# Patient Record
Sex: Male | Born: 1995 | Race: Black or African American | Hispanic: No | Marital: Single | Smoking: Never smoker
Health system: Southern US, Community
[De-identification: ages and names within clinical notes are randomized; demographics above are authoritative.]

## PROBLEM LIST (undated history)

## (undated) DIAGNOSIS — G43909 Migraine, unspecified, not intractable, without status migrainosus: Secondary | ICD-10-CM

## (undated) HISTORY — DX: Migraine, unspecified, not intractable, without status migrainosus: G43.909

---
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
## 2015-01-10 ENCOUNTER — Emergency Department
Admit: 2015-01-10 | Disposition: A | Discharge status: Home / Self Care | Payer: Self-pay | Admitting: Emergency Medicine

## 2015-01-12 ENCOUNTER — Telehealth: Payer: Self-pay | Admitting: Neurology

## 2015-01-12 NOTE — Telephone Encounter (Signed)
The patient is requesting a letter stating that he has migraines and he would like to have that letter.

## 2015-01-12 NOTE — Telephone Encounter (Signed)
I called, left a message. We have no documentation of any visit that the patient had to our office where he was turned away. I am unable to write a letter if I have no documentation.

## 2015-01-12 NOTE — Telephone Encounter (Signed)
I called the patient's mother. She stated the patient needed a letter stating he came to our office to try to see Dr. Anne HahnWillis, but Dr. Anne HahnWillis could not see him, so he will not have an absence counted against him at school. She is not sure what day this happened though. She is going to call back with more details about when this was and what the letter should say.

## 2015-01-23 ENCOUNTER — Encounter: Payer: Self-pay | Admitting: Neurology

## 2015-01-23 NOTE — Telephone Encounter (Signed)
I spoke to the patient. He would like a letter stating that he has migraines secondary to surgery that periodically prevent him from going to school between the months of January and May.

## 2015-01-23 NOTE — Telephone Encounter (Signed)
I will write a letter stating the diagnosis for this patient.

## 2015-01-23 NOTE — Telephone Encounter (Signed)
Patient called stating that he needs a letter stating what his condition is to why he is out for school purpose.  Please call and advice # [phone]

## 2015-01-24 NOTE — Telephone Encounter (Signed)
Left voicemail stating the letter the patient requested is ready for pick up.

## 2015-02-08 ENCOUNTER — Ambulatory Visit: Payer: Medicaid Other | Admitting: Neurology

## 2015-02-08 ENCOUNTER — Telehealth: Payer: Self-pay | Admitting: Neurology

## 2015-02-08 NOTE — Telephone Encounter (Signed)
Pt's mother called and stated that the patient had end of grade testing today at 2:00 and they just find out yesterday that he would have to stay and could not make his appt. She said that she tried to call yesterday afternoon to r/s the appt but did not get an answer. She would like to r/s his appt before he leaves for college and she would like to speak with the nurse regarding the appt and some issues her son has been having. She states that he has an "itching" feeling on his scalp a lot. Please call and advise.

## 2015-02-08 NOTE — Telephone Encounter (Signed)
I called the mother. The patient has itching only at night on top of the head near the surgical scar. The patient is on Benadryl taking 50 mg at night which helped some, may need to go up to 75 mg of Benadryl at night for sleep and to help the itching.

## 2015-02-08 NOTE — Telephone Encounter (Signed)
I called the patient's mother. She stated the patient itches all over his head. She said she thought it was from the scar healing from his surgery, but he actually scratches all over his head. She wondered if this can be nerve damage and if anything can be done about it. She stated the patient scratches his head in his sleep and sometimes until he makes his head bleed. Appointment for today r/s to 6/8.

## 2015-02-22 ENCOUNTER — Telehealth: Payer: Self-pay

## 2015-02-22 ENCOUNTER — Ambulatory Visit: Payer: Medicaid Other | Admitting: Neurology

## 2015-02-22 NOTE — Telephone Encounter (Signed)
Patient cancelled appointment right before he was supposed to be here.

## 2015-02-23 ENCOUNTER — Telehealth: Payer: Self-pay

## 2015-02-23 ENCOUNTER — Ambulatory Visit: Payer: Medicaid Other | Admitting: Neurology

## 2015-02-23 NOTE — Telephone Encounter (Signed)
Patient did not come to a follow up appointment. 

## 2015-03-07 ENCOUNTER — Encounter: Payer: Self-pay | Admitting: Neurology

## 2015-05-03 ENCOUNTER — Ambulatory Visit: Payer: Medicaid Other | Admitting: Neurology

## 2015-09-21 ENCOUNTER — Emergency Department
Admission: EM | Admit: 2015-09-21 | Discharge: 2015-09-21 | Disposition: A | Discharge status: Home / Self Care | Payer: Medicaid Other | Attending: Emergency Medicine | Admitting: Emergency Medicine

## 2015-09-21 ENCOUNTER — Encounter: Payer: Self-pay | Admitting: Urgent Care

## 2015-09-21 ENCOUNTER — Emergency Department: Payer: Medicaid Other

## 2015-09-21 DIAGNOSIS — Y998 Other external cause status: Secondary | ICD-10-CM | POA: Diagnosis not present

## 2015-09-21 DIAGNOSIS — W2203XA Walked into furniture, initial encounter: Secondary | ICD-10-CM | POA: Diagnosis not present

## 2015-09-21 DIAGNOSIS — Y9289 Other specified places as the place of occurrence of the external cause: Secondary | ICD-10-CM | POA: Insufficient documentation

## 2015-09-21 DIAGNOSIS — Z7951 Long term (current) use of inhaled steroids: Secondary | ICD-10-CM | POA: Diagnosis not present

## 2015-09-21 DIAGNOSIS — S0990XA Unspecified injury of head, initial encounter: Secondary | ICD-10-CM | POA: Diagnosis present

## 2015-09-21 DIAGNOSIS — Y9389 Activity, other specified: Secondary | ICD-10-CM | POA: Insufficient documentation

## 2015-09-21 DIAGNOSIS — Z79899 Other long term (current) drug therapy: Secondary | ICD-10-CM | POA: Diagnosis not present

## 2015-09-21 DIAGNOSIS — S060X0A Concussion without loss of consciousness, initial encounter: Secondary | ICD-10-CM | POA: Diagnosis not present

## 2015-09-21 NOTE — ED Notes (Addendum)
Pt c/o episode of confusion after hitting head. Pt reports PMH of head injury.

## 2015-09-21 NOTE — ED Provider Notes (Addendum)
[facility] Emergency Department Provider Note  ____________________________________________  Time seen: Approximately 11:14 PM  I have reviewed the triage vital signs and the nursing notes.   HISTORY  Chief Complaint Head Injury    HPI Jonathan Livingston is a 20 y.o. male with history of brain surgery approximately 2 years ago, who presents with trauma to the posterior, superior scalp occurring 2 days ago. He hit his head on a dresser with low impact. However he has felt more fatigued, drowsy with occasional nausea. He is also felt his thinking isn't as clear as normal. He denies vision changes. He had initial swelling and tenderness to the area but that has resolved. No neck pain arm pain. This felt chilled, but denies sore throat cough or congestion. No abdominal pain.   Past Medical History  Diagnosis Date  . Asthma   . Migraine 11/02/2014    Patient Active Problem List   Diagnosis Date Noted  . Migraine 11/02/2014    Past Surgical History  Procedure Laterality Date  . Brain surgery Right 2015    abcess  . Eye surgery Right 2015    Current Outpatient Rx  Name  Route  Sig  Dispense  Refill  . albuterol (PROVENTIL HFA;VENTOLIN HFA) 108 (90 BASE) MCG/ACT inhaler   Inhalation   Inhale 2 puffs into the lungs every 6 (six) hours as needed for wheezing or shortness of breath (sob).         . fexofenadine (ALLEGRA) 180 MG tablet   Oral   Take 180 mg by mouth daily.         . fluticasone (FLONASE) 50 MCG/ACT nasal spray      Use 2 spray each nare daily         . ibuprofen (ADVIL,MOTRIN) 800 MG tablet   Oral   Take 800 mg by mouth every 8 (eight) hours as needed (fever pain).         Marland Kitchen ketorolac (TORADOL) 10 MG tablet   Oral   Take 1 tablet (10 mg total) by mouth every 8 (eight) hours as needed.   20 tablet   1   . topiramate (TOPAMAX) 25 MG tablet      Take one tablet at night for one week, then take 2 tablets at night for one  week, then take 3 tablets at night.   90 tablet   3     Allergies Review of patient's allergies indicates no known allergies.  Family History  Problem Relation Age of Onset  . Asthma Mother   . Migraines Mother   . Diabetes Father   . Hypertension Father   . Healthy Sister   . Migraines Brother     Social History Social History  Substance Use Topics  . Smoking status: Never Smoker   . Smokeless tobacco: Never Used  . Alcohol Use: No    Review of Systems Constitutional: No fever/chills Eyes: No visual changes. ENT: No sore throat. Cardiovascular: Denies chest pain. Respiratory: Denies shortness of breath. Gastrointestinal: No abdominal pain.  No nausea, no vomiting.  No diarrhea.  No constipation. Genitourinary: Negative for dysuria. Musculoskeletal: Negative for back pain. Skin: Negative for rash. Neurological: Negative for headaches, focal weakness or numbness. 10-point ROS otherwise negative.  ____________________________________________   PHYSICAL EXAM:  VITAL SIGNS: ED Triage Vitals  Enc Vitals Group     BP --      Pulse --      Resp --  Temp --      Temp src --      SpO2 --      Weight --      Height --      Head Cir --      Peak Flow --      Pain Score 09/21/15 2236 0     Pain Loc --      Pain Edu? --      Excl. in GC? --     Constitutional: Alert and oriented. Well appearing and in no acute distress. Eyes: Conjunctivae are normal. PERRL. EOMI. Ears:  Clear with normal landmarks. No erythema. Head: Atraumatic. Nontender. No swelling. Nose: No congestion/rhinnorhea. Mouth/Throat: Mucous membranes are moist.  Oropharynx non-erythematous. No lesions. Neck:  Supple.  No adenopathy.   Cardiovascular: Normal rate, regular rhythm. Grossly normal heart sounds.  Good peripheral circulation. Respiratory: Normal respiratory effort.  No retractions. Lungs CTAB. Musculoskeletal: Nml ROM of upper and lower extremity joints. Neurologic:  Normal  speech and language. No gross focal neurologic deficits are appreciated. No gait instability. Skin:  Skin is warm, dry and intact. No rash noted. Psychiatric: Mood and affect are normal. Speech and behavior are normal.  ____________________________________________   LABS (all labs ordered are listed, but only abnormal results are displayed)  Labs Reviewed - No data to display ____________________________________________  EKG    ____________________________________________  RADIOLOGY  CLINICAL DATA: Acute onset of worsening confusion and somnolence. Recent head injury 2 days ago. Hit head on dresser. Initial encounter.  EXAM: CT HEAD WITHOUT CONTRAST  TECHNIQUE: Contiguous axial images were obtained from the base of the skull through the vertex without intravenous contrast.  COMPARISON: None.  FINDINGS: There is no evidence of acute infarction, mass lesion, or intra- or extra-axial hemorrhage on CT.  The posterior fossa, including the cerebellum, brainstem and fourth ventricle, is within normal limits. The third and lateral ventricles, and basal ganglia are unremarkable in appearance. The cerebral hemispheres are symmetric in appearance, with normal gray-white differentiation. No mass effect or midline shift is seen.  There is no evidence of fracture; postoperative change is noted at the right frontoparietal calvarium. The visualized portions of the orbits are within normal limits. The paranasal sinuses and mastoid air cells are well-aerated. No significant soft tissue abnormalities are seen.  IMPRESSION: No evidence of traumatic intracranial injury or fracture.   Electronically Signed  By: Roanna RaiderJeffery Chang M.D.  On: 09/21/2015 23:20  ____________________________________________   PROCEDURES  Procedure(s) performed: None  Critical Care performed: No  ____________________________________________   INITIAL IMPRESSION / ASSESSMENT AND PLAN  / ED COURSE  Pertinent labs & imaging results that were available during my care of the patient were reviewed by me and considered in my medical decision making (see chart for details).  20 year old male with history of brain surgery approximately 2 years ago who presents with head injury occurring 2 days ago. See history of present illness. CT of the head performed showing no acute findings. He is encouraged to rest. Given work note for tomorrow. Encouraged follow-up with his primary physician if symptoms do not improve. ____________________________________________   FINAL CLINICAL IMPRESSION(S) / ED DIAGNOSES  Final diagnoses:  Concussion, without loss of consciousness, initial encounter      Ignacia BayleyRobert Cathlin Buchan, PA-C 09/21/15 [id number]  Jeanmarie PlantJames A McShane, MD 09/21/15 2330  Ignacia Bayleyobert Chenae Brager, PA-C 09/21/15 [phone]  Jeanmarie PlantJames A McShane, MD 09/22/15 [phone]

## 2015-09-21 NOTE — ED Notes (Signed)
Patient presents with c/o increased confusion and somnolence following a head injury x 2 days ago. Patient reports that he hit his head on a dresser - denies LOC. Patient CAO x 4 in triage. 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

## 2015-09-21 NOTE — Discharge Instructions (Signed)
Head Injury, Adult °You have received a head injury. It does not appear serious at this time. Headaches and vomiting are common following head injury. It should be easy to awaken from sleeping. Sometimes it is necessary for you to stay in the emergency department for a while for observation. Sometimes admission to the hospital may be needed. After injuries such as yours, most problems occur within the first 24 hours, but side effects may occur up to 7-10 days after the injury. It is important for you to carefully monitor your condition and contact your health care provider or seek immediate medical care if there is a change in your condition. °WHAT ARE THE TYPES OF HEAD INJURIES? °Head injuries can be as minor as a bump. Some head injuries can be more severe. More severe head injuries include: °· A jarring injury to the brain (concussion). °· A bruise of the brain (contusion). This mean there is bleeding in the brain that can cause swelling. °· A cracked skull (skull fracture). °· Bleeding in the brain that collects, clots, and forms a bump (hematoma). °WHAT CAUSES A HEAD INJURY? °A serious head injury is most likely to happen to someone who is in a car wreck and is not wearing a seat belt. Other causes of major head injuries include bicycle or motorcycle accidents, sports injuries, and falls. °HOW ARE HEAD INJURIES DIAGNOSED? °A complete history of the event leading to the injury and your current symptoms will be helpful in diagnosing head injuries. Many times, pictures of the brain, such as CT or MRI are needed to see the extent of the injury. Often, an overnight hospital stay is necessary for observation.  °WHEN SHOULD I SEEK IMMEDIATE MEDICAL CARE?  °You should get help right away if: °· You have confusion or drowsiness. °· You feel sick to your stomach (nauseous) or have continued, forceful vomiting. °· You have dizziness or unsteadiness that is getting worse. °· You have severe, continued headaches not  relieved by medicine. Only take over-the-counter or prescription medicines for pain, fever, or discomfort as directed by your health care provider. °· You do not have normal function of the arms or legs or are unable to walk. °· You notice changes in the black spots in the center of the colored part of your eye (pupil). °· You have a clear or bloody fluid coming from your nose or ears. °· You have a loss of vision. °During the next 24 hours after the injury, you must stay with someone who can watch you for the warning signs. This person should contact local emergency services (911 in the [place] if you have seizures, you become unconscious, or you are unable to wake up. °HOW CAN I PREVENT A HEAD INJURY IN THE FUTURE? °The most important factor for preventing major head injuries is avoiding motor vehicle accidents.  To minimize the potential for damage to your head, it is crucial to wear seat belts while riding in motor vehicles. Wearing helmets while bike riding and playing collision sports (like football) is also helpful. Also, avoiding dangerous activities around the house will further help reduce your risk of head injury.  °WHEN CAN I RETURN TO NORMAL ACTIVITIES AND ATHLETICS? °You should be reevaluated by your health care provider before returning to these activities. If you have any of the following symptoms, you should not return to activities or contact sports until 1 week after the symptoms have stopped: °· Persistent headache. °· Dizziness or vertigo. °· Poor attention and concentration. °· Confusion. °·   Memory problems.  Nausea or vomiting.  Fatigue or tire easily.  Irritability.  Intolerant of bright lights or loud noises.  Anxiety or depression.  Disturbed sleep. MAKE SURE YOU:   Understand these instructions.  Will watch your condition.  Will get help right away if you are not doing well or get worse.   This information is not intended to replace advice given to you by your health  care provider. Make sure you discuss any questions you have with your health care provider.   Document Released: 09/02/2005 Document Revised: 09/23/2014 Document Reviewed: 05/10/2013 Elsevier Interactive Patient Education [employer]   Follow-up with your primary physician next week if symptoms do not resolve. Return to the emergency room for any concerns.

## 2015-09-21 NOTE — ED Notes (Signed)
Stafford,MD consulted. MD made aware of presenting complaints and triage assessment. MD with VORB for: CT head without contrast. Orders to be entered and carried by this RN.

## 2017-09-25 ENCOUNTER — Telehealth: Payer: Self-pay | Admitting: Neurology

## 2017-09-25 ENCOUNTER — Institutional Professional Consult (permissible substitution): Payer: Self-pay | Admitting: Neurology

## 2017-09-25 NOTE — Telephone Encounter (Signed)
This patient did not show for a new patient appointment today.  It appears that he no showed on 3 occasions in 2016, not sure why he was not discharged from our practice.

## 2017-09-26 ENCOUNTER — Encounter: Payer: Self-pay | Admitting: Neurology

## 2018-09-22 ENCOUNTER — Encounter: Payer: Self-pay | Admitting: Emergency Medicine

## 2018-09-22 ENCOUNTER — Other Ambulatory Visit: Payer: Self-pay

## 2018-09-22 ENCOUNTER — Emergency Department
Admission: EM | Admit: 2018-09-22 | Discharge: 2018-09-23 | Disposition: A | Discharge status: Home / Self Care | Payer: 59 | Attending: Emergency Medicine | Admitting: Emergency Medicine

## 2018-09-22 DIAGNOSIS — J45909 Unspecified asthma, uncomplicated: Secondary | ICD-10-CM | POA: Insufficient documentation

## 2018-09-22 DIAGNOSIS — Z79899 Other long term (current) drug therapy: Secondary | ICD-10-CM | POA: Insufficient documentation

## 2018-09-22 DIAGNOSIS — J111 Influenza due to unidentified influenza virus with other respiratory manifestations: Secondary | ICD-10-CM | POA: Insufficient documentation

## 2018-09-22 DIAGNOSIS — R6889 Other general symptoms and signs: Secondary | ICD-10-CM

## 2018-09-22 DIAGNOSIS — B958 Unspecified staphylococcus as the cause of diseases classified elsewhere: Secondary | ICD-10-CM | POA: Insufficient documentation

## 2018-09-22 DIAGNOSIS — L089 Local infection of the skin and subcutaneous tissue, unspecified: Secondary | ICD-10-CM | POA: Insufficient documentation

## 2018-09-22 DIAGNOSIS — Z20828 Contact with and (suspected) exposure to other viral communicable diseases: Secondary | ICD-10-CM | POA: Insufficient documentation

## 2018-09-22 LAB — INFLUENZA PANEL BY PCR (TYPE A & B)
INFLAPCR: NEGATIVE
INFLBPCR: NEGATIVE

## 2018-09-22 MED ORDER — SULFAMETHOXAZOLE-TRIMETHOPRIM 800-160 MG PO TABS
1.0000 | ORAL_TABLET | Freq: Once | ORAL | Status: AC
  Administered 2018-09-23: 1 via ORAL
  Filled 2018-09-22: qty 1

## 2018-09-22 NOTE — ED Notes (Signed)
Patient c/o nasal congestion, N/V, productive cough, dizziness, diaphoresis, rash/redness to nose, and intermittent fever.

## 2018-09-22 NOTE — ED Provider Notes (Signed)
[facility] Emergency Department Provider Note  ____________________________________________  Time seen: Approximately 11:48 PM  I have reviewed the triage vital signs and the nursing notes.   HISTORY  Chief Complaint Influenza   HPI Jonathan Livingston is a 23 y.o. male who presents to the ER for evaluation and treatment of fever, chills, night sweats, cough, nasal congestion, intermittent vomiting and a rash to his nose. Sick contacts positive for flu. He has had the same rash in the past that lead to a sinus surgery. Rash started 2 days ago and is spreading around his nares bilaterally and inside the nose. No alleviating measures attempted prior to arrival.   Past Medical History:  Diagnosis Date  . Asthma   . Migraine 11/02/2014    Patient Active Problem List   Diagnosis Date Noted  . Migraine 11/02/2014    Past Surgical History:  Procedure Laterality Date  . BRAIN SURGERY Right 2015   abcess  . EYE SURGERY Right 2015    Prior to Admission medications   Medication Sig Start Date End Date Taking? Authorizing Provider  albuterol (PROVENTIL HFA;VENTOLIN HFA) 108 (90 BASE) MCG/ACT inhaler Inhale 2 puffs into the lungs every 6 (six) hours as needed for wheezing or shortness of breath (sob).    [provider]  fexofenadine (ALLEGRA) 180 MG tablet Take 180 mg by mouth daily.    [provider]  fluticasone Aleda Grana) 50 MCG/ACT nasal spray Use 2 spray each nare daily 10/13/13   [provider]  ibuprofen (ADVIL,MOTRIN) 800 MG tablet Take 800 mg by mouth every 8 (eight) hours as needed (fever pain).    [provider]  ketorolac (TORADOL) 10 MG tablet Take 1 tablet (10 mg total) by mouth every 8 (eight) hours as needed. 12/19/14   York Spaniel, MD  promethazine-codeine (PHENERGAN WITH CODEINE) 6.25-10 MG/5ML syrup Take 5 mLs by mouth every 6 (six) hours as needed for cough. 09/23/18   Brie Eppard, Rulon Eisenmenger B, FNP   sulfamethoxazole-trimethoprim (BACTRIM DS,SEPTRA DS) 800-160 MG tablet Take 1 tablet by mouth 2 (two) times daily. 09/23/18   Nasira Janusz, Rulon Eisenmenger B, FNP  topiramate (TOPAMAX) 25 MG tablet Take one tablet at night for one week, then take 2 tablets at night for one week, then take 3 tablets at night. 11/02/14   York Spaniel, MD    Allergies Patient has no known allergies.  Family History  Problem Relation Age of Onset  . Asthma Mother   . Migraines Mother   . Diabetes Father   . Hypertension Father   . Healthy Sister   . Migraines Brother     Social History Social History   Tobacco Use  . Smoking status: Never Smoker  . Smokeless tobacco: Never Used  Substance Use Topics  . Alcohol use: No  . Drug use: Not on file    Review of Systems Constitutional: Positive for fever/chills. Decreased appetite. ENT: Negative sore throat. Cardiovascular: Denies chest pain. Respiratory: Negative for shortness of breath. Positive for cough. Negative for wheezing.  Gastrointestinal: Positive for nausea,  intermittent vomiting.  Negative for diarrhea.  Musculoskeletal: Positive for body aches Skin: Positive for rash. Neurological: Negative for headaches ____________________________________________   PHYSICAL EXAM:  VITAL SIGNS: ED Triage Vitals  Enc Vitals Group     BP 09/22/18 2138 139/72     Pulse Rate 09/22/18 2138 72     Resp 09/22/18 2138 16     Temp 09/22/18 2138 98.7 F (37.1 C)  Temp Source 09/22/18 2138 Oral     SpO2 09/22/18 2138 99 %     Weight 09/22/18 2140 170 lb (77.1 kg)     Height 09/22/18 2140 5\' 9"  (1.753 m)     Head Circumference --      Peak Flow --      Pain Score 09/22/18 2143 6     Pain Loc --      Pain Edu? --      Excl. in GC? --     Constitutional: Alert and oriented. Well appearing and in no acute distress. Eyes: Conjunctivae are normal. Ears: Bilateral TM normal Nose: Maxillary sinus congestion noted; no rhinnorhea. Mouth/Throat: Mucous  membranes are moist.  Oropharynx mildly erythematous without edema. Tonsils without exudate. Uvula midline. Neck: No stridor.  Lymphatic: No cervical lymphadenopathy. Cardiovascular: Normal rate, regular rhythm. Good peripheral circulation. Respiratory: Respirations are even and unlabored.  No retractions. Breath sounds clear to auscultation.. Gastrointestinal: Soft and nontender.  Musculoskeletal: FROM x 4 extremities.  Neurologic:  Normal speech and language. Skin:  Skin is warm, dry and intact. Skin on upper lip and around nares has a crusted exudate on an erythematous base. Nasal mucosa bilaterally with papules on an erythematous base. Psychiatric: Mood and affect are normal. Speech and behavior are normal.  ____________________________________________   LABS (all labs ordered are listed, but only abnormal results are displayed)  Labs Reviewed  INFLUENZA PANEL BY PCR (TYPE A & B)   ____________________________________________  EKG  Not indicated. ____________________________________________  RADIOLOGY  Not indicated. ____________________________________________   PROCEDURES  Procedure(s) performed: None  Critical Care performed: No ____________________________________________   INITIAL IMPRESSION / ASSESSMENT AND PLAN / ED COURSE  23 y.o. male presents to the ER for evaluation of flu-like symptoms and rash on the nose. He will be treated symptomatically for the viral symptoms and given Bactrim for the suspected MRSA of the nose. He is to follow up with the primary care provider for symptoms that are not improving over the week. He is to return to the ER for symptoms that change or worsen if unable to schedule an appointment.   Medications  sulfamethoxazole-trimethoprim (BACTRIM DS,SEPTRA DS) 800-160 MG per tablet 1 tablet (1 tablet Oral Given 09/23/18 0011)    ED Discharge Orders         Ordered    sulfamethoxazole-trimethoprim (BACTRIM DS,SEPTRA DS) 800-160 MG  tablet  2 times daily     09/23/18 0002    promethazine-codeine (PHENERGAN WITH CODEINE) 6.25-10 MG/5ML syrup  Every 6 hours PRN     09/23/18 0002           Pertinent labs & imaging results that were available during my care of the patient were reviewed by me and considered in my medical decision making (see chart for details).    If controlled substance prescribed during this visit, 12 month history viewed on the [facility] prior to issuing an initial prescription for Schedule II or III opiod. ____________________________________________   FINAL CLINICAL IMPRESSION(S) / ED DIAGNOSES  Final diagnoses:  Flu-like symptoms  Infection, skin, staph    Note:  This document was prepared using Dragon voice recognition software and may include unintentional dictation errors.     Chinita Pester, FNP 09/23/18 1559    Dionne Bucy, MD 09/23/18 [phone]

## 2018-09-22 NOTE — ED Triage Notes (Signed)
Pt arriving to the ED for complaints of flu like symptoms, cough, runny nose and fever. Pt reports that his girlfriend was diagnosed with the Flu and now he believes that he has it. Pt is AOx4 in no apparent distress.

## 2018-09-22 NOTE — ED Notes (Signed)
Patient reports headache with swelling/pus in the past leading to sinus surgery. Patient is unsure if pain/nasal swelling is related to this.

## 2018-09-23 MED ORDER — SULFAMETHOXAZOLE-TRIMETHOPRIM 800-160 MG PO TABS: 1.0000 | ORAL_TABLET | Freq: Two times a day (BID) | ORAL | 0 refills | Status: AC

## 2018-09-23 MED ORDER — PROMETHAZINE-CODEINE 6.25-10 MG/5ML PO SYRP: 5.0000 mL | ORAL_SOLUTION | Freq: Four times a day (QID) | ORAL | 0 refills | Status: AC | PRN

## 2018-09-23 NOTE — Discharge Instructions (Signed)
Follow up with the primary care provider for symptoms that are not improving over the week. ° °Return to the ER for symptoms that change or worsen if unable to schedule an appointment. °

## 2019-04-13 ENCOUNTER — Ambulatory Visit: Payer: Self-pay

## 2019-04-13 ENCOUNTER — Encounter: Payer: Self-pay | Admitting: Emergency Medicine

## 2019-04-13 ENCOUNTER — Emergency Department
Admission: EM | Admit: 2019-04-13 | Discharge: 2019-04-13 | Disposition: A | Discharge status: Home / Self Care | Payer: Self-pay | Attending: Emergency Medicine | Admitting: Emergency Medicine

## 2019-04-13 ENCOUNTER — Other Ambulatory Visit: Payer: Self-pay

## 2019-04-13 DIAGNOSIS — J45909 Unspecified asthma, uncomplicated: Secondary | ICD-10-CM | POA: Insufficient documentation

## 2019-04-13 DIAGNOSIS — K625 Hemorrhage of anus and rectum: Secondary | ICD-10-CM | POA: Insufficient documentation

## 2019-04-13 LAB — URINALYSIS, COMPLETE (UACMP) WITH MICROSCOPIC
Bacteria, UA: NONE SEEN
Bilirubin Urine: NEGATIVE
Glucose, UA: NEGATIVE mg/dL
Ketones, ur: NEGATIVE mg/dL
Leukocytes,Ua: NEGATIVE
Nitrite: NEGATIVE
Protein, ur: NEGATIVE mg/dL
Specific Gravity, Urine: 1.024 (ref 1.005–1.030)
Squamous Epithelial / HPF: NONE SEEN (ref 0–5)
pH: 6 (ref 5.0–8.0)

## 2019-04-13 LAB — CBC
HCT: 42.4 % (ref 39.0–52.0)
Hemoglobin: 14.2 g/dL (ref 13.0–17.0)
MCH: 28.6 pg (ref 26.0–34.0)
MCHC: 33.5 g/dL (ref 30.0–36.0)
MCV: 85.5 fL (ref 80.0–100.0)
Platelets: 195 10*3/uL (ref 150–400)
RBC: 4.96 MIL/uL (ref 4.22–5.81)
RDW: 13.1 % (ref 11.5–15.5)
WBC: 4.7 10*3/uL (ref 4.0–10.5)
nRBC: 0 % (ref 0.0–0.2)

## 2019-04-13 LAB — LIPASE, BLOOD: Lipase: 20 U/L (ref 11–51)

## 2019-04-13 LAB — COMPREHENSIVE METABOLIC PANEL
ALT: 22 U/L (ref 0–44)
AST: 24 U/L (ref 15–41)
Albumin: 4.6 g/dL (ref 3.5–5.0)
Alkaline Phosphatase: 71 U/L (ref 38–126)
Anion gap: 8 (ref 5–15)
BUN: 16 mg/dL (ref 6–20)
CO2: 25 mmol/L (ref 22–32)
Calcium: 9.4 mg/dL (ref 8.9–10.3)
Chloride: 101 mmol/L (ref 98–111)
Creatinine, Ser: 0.83 mg/dL (ref 0.61–1.24)
GFR calc Af Amer: >60 mL/min (ref >60)
GFR calc non Af Amer: >60 mL/min (ref >60)
Glucose, Bld: 115 mg/dL — ABNORMAL HIGH (ref 70–99)
Potassium: 3.6 mmol/L (ref 3.5–5.1)
Sodium: 134 mmol/L — ABNORMAL LOW (ref 135–145)
Total Bilirubin: 0.6 mg/dL (ref 0.3–1.2)
Total Protein: 8.2 g/dL — ABNORMAL HIGH (ref 6.5–8.1)

## 2019-04-13 MED ORDER — SODIUM CHLORIDE 0.9% FLUSH: 3.0000 mL | Freq: Once | INTRAVENOUS | Status: DC

## 2019-04-13 MED ORDER — DICYCLOMINE HCL 20 MG PO TABS: 20.0000 mg | ORAL_TABLET | Freq: Three times a day (TID) | ORAL | 0 refills | Status: AC | PRN

## 2019-04-13 MED ORDER — SUCRALFATE 1 G PO TABS: 1.0000 g | ORAL_TABLET | Freq: Four times a day (QID) | ORAL | 0 refills | Status: AC

## 2019-04-13 NOTE — ED Triage Notes (Signed)
Blood in stool x 2 over past two days.  Denies constipation.  Patient is AAOx3.  Skin warm and dry. NAD

## 2019-04-13 NOTE — Telephone Encounter (Signed)
Pt called with 2 episodes of rectal bleeding. Started yesterday that pt noted dark red blood on toilet paper but did not look into bowl. Today pt had another episodes of blood in stool that turned the toilet water red. Pt also c/o lightheadedness. Pt advised to go ED but have someone to drive him. Pt stated he will go to [facility] ED. Care advice given and pt verbalized understanding.  Reason for Disposition . SEVERE dizziness (e.g., unable to stand, requires support to walk, feels like passing out now)  Answer Assessment - Initial Assessment Questions 1. APPEARANCE of BLOOD: "What color is it?" "Is it passed separately, on the surface of the stool, or mixed in with the stool?"      Dark red - on and mixed in with the stool 2. AMOUNT: "How much blood was passed?"      Colored water pink 3. FREQUENCY: "How many times has blood been passed with the stools?"      2 4. ONSET: "When was the blood first seen in the stools?" (Days or weeks)      yesterday 5. DIARRHEA: "Is there also some diarrhea?" If so, ask: "How many diarrhea stools were passed in past 24 hours?"      no 6. CONSTIPATION: "Do you have constipation?" If so, "How bad is it?"     no 7. RECURRENT SYMPTOMS: "Have you had blood in your stools before?" If so, ask: "When was the last time?" and "What happened that time?"      no 8. BLOOD THINNERS: "Do you take any blood thinners?" (e.g., Coumadin/warfarin, Pradaxa/dabigatran, aspirin)     no 9. OTHER SYMPTOMS: "Do you have any other symptoms?"  (e.g., abdominal pain, vomiting, dizziness, fever)     Feels lightheaded and weak 10. PREGNANCY: "Is there any chance you are pregnant?" "When was your last menstrual period?"       n/a  Protocols used: RECTAL BLEEDING-A-AH

## 2019-04-13 NOTE — Discharge Instructions (Signed)
Please seek medical attention for any high fevers, chest pain, shortness of breath, change in behavior, persistent vomiting, bloody stool or any other new or concerning symptoms.  

## 2019-04-13 NOTE — ED Provider Notes (Signed)
[facility] Emergency Department Provider Note  ____________________________________________   I have reviewed the triage vital signs and the nursing notes.   HISTORY  Chief Complaint Rectal Bleeding   History limited by: Not Limited   HPI Jonathan Livingston is a 23 y.o. male who presents to the emergency department today because of concerns for rectal bleeding.  Patient states it started yesterday.  He noticed some blood when wiping after having a bowel movement.  Today he noticed some further blood and blood in the toilet.  The patient states he has had some mild abdominal discomfort but nothing significant.  He denies any straining while stooling or prolonged time on the toilet.  Denies any history of intestinal disorder.  Denies any known intestinal disorder history in close family.    Records reviewed. Per medical record review patient has a history of asthma, migraine.  Past Medical History:  Diagnosis Date  . Asthma   . Migraine 11/02/2014    Patient Active Problem List   Diagnosis Date Noted  . Migraine 11/02/2014    Past Surgical History:  Procedure Laterality Date  . BRAIN SURGERY Right 2015   abcess  . EYE SURGERY Right 2015    Prior to Admission medications   Medication Sig Start Date End Date Taking? Authorizing Provider  albuterol (PROVENTIL HFA;VENTOLIN HFA) 108 (90 BASE) MCG/ACT inhaler Inhale 2 puffs into the lungs every 6 (six) hours as needed for wheezing or shortness of breath (sob).    [provider]  fexofenadine (ALLEGRA) 180 MG tablet Take 180 mg by mouth daily.    [provider]  fluticasone Asencion Islam) 50 MCG/ACT nasal spray Use 2 spray each nare daily 10/13/13   [provider]  ibuprofen (ADVIL,MOTRIN) 800 MG tablet Take 800 mg by mouth every 8 (eight) hours as needed (fever pain).    [provider]  ketorolac (TORADOL) 10 MG tablet Take 1 tablet (10 mg total) by mouth every 8 (eight)  hours as needed. 12/19/14   Kathrynn Ducking, MD  promethazine-codeine (PHENERGAN WITH CODEINE) 6.25-10 MG/5ML syrup Take 5 mLs by mouth every 6 (six) hours as needed for cough. 09/23/18   Triplett, Johnette Abraham B, FNP  sulfamethoxazole-trimethoprim (BACTRIM DS,SEPTRA DS) 800-160 MG tablet Take 1 tablet by mouth 2 (two) times daily. 09/23/18   Triplett, Johnette Abraham B, FNP  topiramate (TOPAMAX) 25 MG tablet Take one tablet at night for one week, then take 2 tablets at night for one week, then take 3 tablets at night. 11/02/14   Kathrynn Ducking, MD    Allergies Patient has no known allergies.  Family History  Problem Relation Age of Onset  . Asthma Mother   . Migraines Mother   . Diabetes Father   . Hypertension Father   . Healthy Sister   . Migraines Brother     Social History Social History   Tobacco Use  . Smoking status: Never Smoker  . Smokeless tobacco: Never Used  Substance Use Topics  . Alcohol use: No  . Drug use: Not on file    Review of Systems Constitutional: No fever/chills Eyes: No visual changes. ENT: No sore throat. Cardiovascular: Denies chest pain. Respiratory: Denies shortness of breath. Gastrointestinal: Positive for abdominal pain and rectal bleeding.   Genitourinary: Negative for dysuria. Musculoskeletal: Negative for back pain. Skin: Negative for rash. Neurological: Negative for headaches, focal weakness or numbness.  ____________________________________________   PHYSICAL EXAM:  VITAL SIGNS: ED Triage Vitals  Enc Vitals Group  BP 04/13/19 1756 128/66     Pulse Rate 04/13/19 1756 77     Resp 04/13/19 1756 16     Temp 04/13/19 1756 98.7 F (37.1 C)     Temp Source 04/13/19 1756 Oral     SpO2 04/13/19 1756 99 %     Weight 04/13/19 1754 169 lb 15.6 oz (77.1 kg)     Height 04/13/19 1754 5\' 9"  (1.753 m)     Head Circumference --      Peak Flow --      Pain Score 04/13/19 1753 3   Constitutional: Alert and oriented.  Eyes: Conjunctivae are normal.   ENT      Head: Normocephalic and atraumatic.      Nose: No congestion/rhinnorhea.      Mouth/Throat: Mucous membranes are moist.      Neck: No stridor. Hematological/Lymphatic/Immunilogical: No cervical lymphadenopathy. Cardiovascular: Normal rate, regular rhythm.  No murmurs, rubs, or gallops.  Respiratory: Normal respiratory effort without tachypnea nor retractions. Breath sounds are clear and equal bilaterally. No wheezes/rales/rhonchi. Gastrointestinal: Soft and minimally tender in the lower abdomen.  Genitourinary: Deferred Musculoskeletal: Normal range of motion in all extremities. No lower extremity edema. Neurologic:  Normal speech and language. No gross focal neurologic deficits are appreciated.  Skin:  Skin is warm, dry and intact. No rash noted. Psychiatric: Mood and affect are normal. Speech and behavior are normal. Patient exhibits appropriate insight and judgment.  ____________________________________________    LABS (pertinent positives/negatives)  CMP wnl except na 134, glu 115, t pro 8.2 CBC wbc 4.7, hgb 14.2, plt 195 Lipase 20 UA clear, hgb dipstick small, otherwise wnl   ____________________________________________   EKG  None  ____________________________________________    RADIOLOGY  None   ____________________________________________   PROCEDURES  Procedures  ____________________________________________   INITIAL IMPRESSION / ASSESSMENT AND PLAN / ED COURSE  Pertinent labs & imaging results that were available during my care of the patient were reviewed by me and considered in my medical decision making (see chart for details).   Patient presented to the emergency department today because of concerns for rectal bleeding.  On exam patient has some mild tenderness to palpation lower abdomen.  Blood work without concerning anemia or leukocytosis.  Had a discussion with the patient.  At this point I think either infectious etiology or possible  inflammatory type process likely.  Will give patient prescription for medication to help with symptoms.  Discussed return precautions.  Discussed portance of follow-up with GI.   ____________________________________________   FINAL CLINICAL IMPRESSION(S) / ED DIAGNOSES  Final diagnoses:  Rectal bleeding     Note: This dictation was prepared with Dragon dictation. Any transcriptional errors that result from this process are unintentional     Phineas SemenGoodman, Trachelle Low, MD 04/13/19 [phone]

## 2019-04-13 NOTE — ED Notes (Signed)
No answer when called several times from lobby 

## 2021-09-13 ENCOUNTER — Encounter: Payer: Self-pay | Admitting: Family Medicine

## 2021-09-13 ENCOUNTER — Other Ambulatory Visit: Payer: Self-pay

## 2021-09-13 ENCOUNTER — Ambulatory Visit: Payer: Self-pay | Admitting: Family Medicine

## 2021-09-13 DIAGNOSIS — Z113 Encounter for screening for infections with a predominantly sexual mode of transmission: Secondary | ICD-10-CM

## 2021-09-13 LAB — GRAM STAIN

## 2021-09-13 NOTE — Progress Notes (Signed)
[facility] Department STI clinic/screening visit  Subjective:  Jonathan Livingston is a 25 y.o. male being seen today for an STI screening visit. The patient reports they do not have symptoms.    Patient has the following medical conditions:   Patient Active Problem List   Diagnosis Date Noted   Migraine 11/02/2014     Chief Complaint  Patient presents with   SEXUALLY TRANSMITTED DISEASE    Screening    HPI  Patient reports her for screening, denies s/sx   Does the patient or their partner desires a pregnancy in the next year? No  Screening for MPX risk: Does the patient have an unexplained rash? No Is the patient MSM? No Does the patient endorse multiple sex partners or anonymous sex partners? Yes Did the patient have close or sexual contact with a person diagnosed with MPX? No Has the patient traveled outside the [place] where MPX is endemic? No Is there a high clinical suspicion for MPX-- evidenced by one of the following No  -Unlikely to be chickenpox  -Lymphadenopathy  -Rash that present in same phase of evolution on any given body part   See flowsheet for further details and programmatic requirements.    The following portions of the patient's history were reviewed and updated as appropriate: allergies, current medications, past medical history, past social history, past surgical history and problem list.  Objective:  There were no vitals filed for this visit.  Physical Exam Constitutional:      Appearance: Normal appearance.  HENT:     Head: Normocephalic.     Mouth/Throat:     Mouth: Mucous membranes are moist.     Pharynx: Oropharynx is clear. No oropharyngeal exudate.  Pulmonary:     Effort: Pulmonary effort is normal.  Genitourinary:    Penis: Normal.      Testes: Normal.     Comments: No lice, nits, or pest, no lesions or odor discharge.  Denies pain or tenderness with paplation of testicles.  No lesions, ulcers or masses present.     Musculoskeletal:     Cervical back: Normal range of motion and neck supple.  Lymphadenopathy:     Cervical: No cervical adenopathy.  Skin:    General: Skin is warm and dry.     Findings: No bruising, erythema, lesion or rash.  Neurological:     Mental Status: He is alert and oriented to person, place, and time.  Psychiatric:        Mood and Affect: Mood normal.        Behavior: Behavior normal.      Assessment and Plan:  Jonathan Livingston is a 25 y.o. male presenting to the [facility] Department for STI screening  1. Screening examination for venereal disease Patient does not have STI symptoms Patient accepted all screenings including  gram stain,  CT/GC and bloodwork for HIV/RPR.  Patient meets criteria for HepB screening? No. Ordered? No - does not meet criteria  Patient meets criteria for HepC screening? No. Ordered? No - doe not meet criteria  Recommended condom use with all sex Discussed importance of condom use for STI prevent  Treat gram stain per standing order Discussed time line for State Lab results and that patient will be called with positive results and encouraged patient to call if he had not heard in 2 weeks Recommended returning for continued or worsening symptoms.  - Chlamydia/Gonorrhea [place] [facility] - Gram stain - HIV [place] [facility] -  Syphilis Serology, [place] [facility]   No follow-ups on file.  No future appointments.  Wendi Snipes, FNP

## 2021-09-20 ENCOUNTER — Telehealth: Payer: Self-pay

## 2021-09-20 NOTE — Telephone Encounter (Signed)
Pt returned phone call. Pt does desire testing for HIV and syphilis.  Lab only appt scheduled for bloodwork on 09/24/20 per pt preference.  Pt may inquire about other pending labs, not available as of 09/20/20.  If results from GC/Chlamydia are still not in, may have to have provider recollect this or pt provide a urine sample. To be determined.

## 2021-09-20 NOTE — Telephone Encounter (Signed)
Calling pt regarding specimens collected on 09/13/21 for HIV and Syphilis. Unable to test these samples. Need to schedule lab only appt to collect new specimens for testing.  Phone call to pt at [phone]. Left message on voicemail about situation and please return call to [facility] at [phone].

## 2021-09-24 ENCOUNTER — Other Ambulatory Visit: Payer: Medicaid Other

## 2021-09-25 NOTE — Telephone Encounter (Signed)
GC/Chlamydia results are available on state lab site.

## 2021-09-25 NOTE — Progress Notes (Signed)
Cancelled orders for bloodwork, HIV and syphilis. Per state lab unable to run specimen. °

## 2021-09-25 NOTE — Addendum Note (Signed)
Addended by: Doy Mince on: 09/25/2021 04:30 PM   Modules accepted: Orders

## 2022-01-22 ENCOUNTER — Ambulatory Visit: Payer: Medicaid Other

## 2022-10-10 ENCOUNTER — Ambulatory Visit: Payer: Medicaid Other

## 2022-10-28 ENCOUNTER — Ambulatory Visit: Payer: Medicaid Other | Admitting: Family Medicine

## 2024-07-21 ENCOUNTER — Other Ambulatory Visit: Payer: Self-pay

## 2024-07-21 ENCOUNTER — Emergency Department (HOSPITAL_COMMUNITY)

## 2024-07-21 ENCOUNTER — Inpatient Hospital Stay (HOSPITAL_COMMUNITY)
Admission: EM | Admit: 2024-07-21 | Discharge: 2024-07-23 | DRG: 563 | Disposition: A | Discharge status: Home / Self Care | Attending: Hospitalist | Admitting: Hospitalist

## 2024-07-21 ENCOUNTER — Encounter (HOSPITAL_COMMUNITY): Payer: Self-pay | Admitting: Emergency Medicine

## 2024-07-21 DIAGNOSIS — E66811 Obesity, class 1: Secondary | ICD-10-CM | POA: Diagnosis present

## 2024-07-21 DIAGNOSIS — Y249XXA Unspecified firearm discharge, undetermined intent, initial encounter: Secondary | ICD-10-CM | POA: Diagnosis present

## 2024-07-21 DIAGNOSIS — S82451B Displaced comminuted fracture of shaft of right fibula, initial encounter for open fracture type I or II: Secondary | ICD-10-CM

## 2024-07-21 DIAGNOSIS — Z6831 Body mass index (BMI) 31.0-31.9, adult: Secondary | ICD-10-CM

## 2024-07-21 DIAGNOSIS — D649 Anemia, unspecified: Secondary | ICD-10-CM | POA: Diagnosis present

## 2024-07-21 DIAGNOSIS — S81831A Puncture wound without foreign body, right lower leg, initial encounter: Principal | ICD-10-CM

## 2024-07-21 DIAGNOSIS — Z23 Encounter for immunization: Secondary | ICD-10-CM

## 2024-07-21 DIAGNOSIS — S82451A Displaced comminuted fracture of shaft of right fibula, initial encounter for closed fracture: Principal | ICD-10-CM | POA: Diagnosis present

## 2024-07-21 LAB — URINALYSIS, ROUTINE W REFLEX MICROSCOPIC
Bacteria, UA: NONE SEEN
Bilirubin Urine: NEGATIVE
Glucose, UA: NEGATIVE mg/dL
Hgb urine dipstick: NEGATIVE
Ketones, ur: NEGATIVE mg/dL
Leukocytes,Ua: NEGATIVE
Nitrite: NEGATIVE
Protein, ur: NEGATIVE mg/dL
Specific Gravity, Urine: 1.018 (ref 1.005–1.030)
pH: 7 (ref 5.0–8.0)

## 2024-07-21 LAB — I-STAT CHEM 8, ED
BUN: 10 mg/dL (ref 6–20)
Calcium, Ion: 1.11 mmol/L — ABNORMAL LOW (ref 1.15–1.40)
Chloride: 102 mmol/L (ref 98–111)
Creatinine, Ser: 1.1 mg/dL (ref 0.61–1.24)
Glucose, Bld: 139 mg/dL — ABNORMAL HIGH (ref 70–99)
HCT: 42 % (ref 39.0–52.0)
Hemoglobin: 14.3 g/dL (ref 13.0–17.0)
Potassium: 3.3 mmol/L — ABNORMAL LOW (ref 3.5–5.1)
Sodium: 136 mmol/L (ref 135–145)
TCO2: 18 mmol/L — ABNORMAL LOW (ref 22–32)

## 2024-07-21 LAB — PROTIME-INR
INR: 1.1 (ref 0.8–1.2)
Prothrombin Time: 14.6 s (ref 11.4–15.2)

## 2024-07-21 LAB — COMPREHENSIVE METABOLIC PANEL WITH GFR
ALT: 42 U/L (ref 0–44)
AST: 37 U/L (ref 15–41)
Albumin: 3.9 g/dL (ref 3.5–5.0)
Alkaline Phosphatase: 74 U/L (ref 38–126)
Anion gap: 16 — ABNORMAL HIGH (ref 5–15)
BUN: 9 mg/dL (ref 6–20)
CO2: 22 mmol/L (ref 22–32)
Calcium: 9.1 mg/dL (ref 8.9–10.3)
Chloride: 98 mmol/L (ref 98–111)
Creatinine, Ser: 1.01 mg/dL (ref 0.61–1.24)
GFR, Estimated: >60 mL/min (ref >60)
Glucose, Bld: 165 mg/dL — ABNORMAL HIGH (ref 70–99)
Potassium: 3.7 mmol/L (ref 3.5–5.1)
Sodium: 136 mmol/L (ref 135–145)
Total Bilirubin: 0.3 mg/dL (ref 0.0–1.2)
Total Protein: 7.4 g/dL (ref 6.5–8.1)

## 2024-07-21 LAB — SAMPLE TO BLOOD BANK

## 2024-07-21 LAB — ETHANOL: Alcohol, Ethyl (B): <15 mg/dL (ref <15)

## 2024-07-21 MED ORDER — FENTANYL CITRATE (PF) 100 MCG/2ML IJ SOLN
INTRAMUSCULAR | Status: AC | PRN
  Administered 2024-07-21: 50 ug via INTRAVENOUS

## 2024-07-21 MED ORDER — DIAZEPAM 5 MG/ML IJ SOLN
2.5000 mg | Freq: Once | INTRAMUSCULAR | Status: AC
  Administered 2024-07-21: 2.5 mg via INTRAVENOUS
  Filled 2024-07-21: qty 2

## 2024-07-21 MED ORDER — ONDANSETRON HCL 4 MG/2ML IJ SOLN: 4.0000 mg | Freq: Four times a day (QID) | INTRAMUSCULAR | Status: DC | PRN

## 2024-07-21 MED ORDER — CEFAZOLIN SODIUM-DEXTROSE 2-4 GM/100ML-% IV SOLN
2.0000 g | Freq: Once | INTRAVENOUS | Status: AC
  Administered 2024-07-21: 2 g via INTRAVENOUS

## 2024-07-21 MED ORDER — HYDROMORPHONE HCL 1 MG/ML IJ SOLN
1.0000 mg | Freq: Once | INTRAMUSCULAR | Status: AC
  Administered 2024-07-21: 1 mg via INTRAVENOUS
  Filled 2024-07-21: qty 1

## 2024-07-21 MED ORDER — HYDROMORPHONE HCL 1 MG/ML IJ SOLN
0.5000 mg | INTRAMUSCULAR | Status: DC | PRN
  Administered 2024-07-22 – 2024-07-23 (×7): 1 mg via INTRAVENOUS
  Filled 2024-07-21 (×7): qty 1

## 2024-07-21 MED ORDER — ACETAMINOPHEN 650 MG RE SUPP: 650.0000 mg | Freq: Four times a day (QID) | RECTAL | Status: DC | PRN

## 2024-07-21 MED ORDER — FENTANYL CITRATE (PF) 50 MCG/ML IJ SOSY
PREFILLED_SYRINGE | INTRAMUSCULAR | Status: AC
  Filled 2024-07-21: qty 3

## 2024-07-21 MED ORDER — ACETAMINOPHEN 325 MG PO TABS: 650.0000 mg | ORAL_TABLET | Freq: Four times a day (QID) | ORAL | Status: DC | PRN

## 2024-07-21 MED ORDER — TETANUS-DIPHTH-ACELL PERTUSSIS 5-2-15.5 LF-MCG/0.5 IM SUSP
0.5000 mL | Freq: Once | INTRAMUSCULAR | Status: AC
  Administered 2024-07-21: 0.5 mL via INTRAMUSCULAR

## 2024-07-21 MED ORDER — ONDANSETRON HCL 4 MG PO TABS: 4.0000 mg | ORAL_TABLET | Freq: Four times a day (QID) | ORAL | Status: DC | PRN

## 2024-07-21 MED ORDER — ENOXAPARIN SODIUM 60 MG/0.6ML IJ SOSY
50.0000 mg | PREFILLED_SYRINGE | INTRAMUSCULAR | Status: DC
  Administered 2024-07-22 – 2024-07-23 (×2): 50 mg via SUBCUTANEOUS
  Filled 2024-07-21 (×2): qty 0.6

## 2024-07-21 MED ORDER — OXYCODONE HCL 5 MG PO TABS
5.0000 mg | ORAL_TABLET | ORAL | Status: DC | PRN
  Administered 2024-07-22 (×3): 5 mg via ORAL
  Filled 2024-07-21 (×3): qty 1

## 2024-07-21 MED ORDER — DIAZEPAM 5 MG PO TABS
10.0000 mg | ORAL_TABLET | Freq: Once | ORAL | Status: AC
  Administered 2024-07-22: 10 mg via ORAL
  Filled 2024-07-21: qty 2

## 2024-07-21 MED ADMIN — Ketorolac Tromethamine Inj 15 MG/ML: 15 mg | INTRAVENOUS | NDC 72266023401

## 2024-07-21 MED FILL — Ketorolac Tromethamine Inj 15 MG/ML: 15.0000 mg | INTRAMUSCULAR | Qty: 1 | Status: AC
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
## 2024-07-21 NOTE — ED Notes (Signed)
 Pt provided sandwich bag and drink per admit order

## 2024-07-22 DIAGNOSIS — S82451A Displaced comminuted fracture of shaft of right fibula, initial encounter for closed fracture: Secondary | ICD-10-CM | POA: Diagnosis present

## 2024-07-22 DIAGNOSIS — Z6831 Body mass index (BMI) 31.0-31.9, adult: Secondary | ICD-10-CM | POA: Diagnosis not present

## 2024-07-22 DIAGNOSIS — Z23 Encounter for immunization: Secondary | ICD-10-CM | POA: Diagnosis not present

## 2024-07-22 DIAGNOSIS — Y249XXA Unspecified firearm discharge, undetermined intent, initial encounter: Secondary | ICD-10-CM | POA: Diagnosis present

## 2024-07-22 DIAGNOSIS — D649 Anemia, unspecified: Secondary | ICD-10-CM | POA: Diagnosis present

## 2024-07-22 DIAGNOSIS — E66811 Obesity, class 1: Secondary | ICD-10-CM | POA: Diagnosis present

## 2024-07-22 LAB — CREATININE, SERUM
Creatinine, Ser: 1.02 mg/dL (ref 0.61–1.24)
GFR, Estimated: >60 mL/min (ref >60)

## 2024-07-22 LAB — BASIC METABOLIC PANEL WITH GFR
Anion gap: 13 (ref 5–15)
BUN: 10 mg/dL (ref 6–20)
CO2: 23 mmol/L (ref 22–32)
Calcium: 9.2 mg/dL (ref 8.9–10.3)
Chloride: 100 mmol/L (ref 98–111)
Creatinine, Ser: 0.86 mg/dL (ref 0.61–1.24)
GFR, Estimated: >60 mL/min (ref >60)
Glucose, Bld: 126 mg/dL — ABNORMAL HIGH (ref 70–99)
Potassium: 4.3 mmol/L (ref 3.5–5.1)
Sodium: 136 mmol/L (ref 135–145)

## 2024-07-22 LAB — CBC
HCT: 37.8 % — ABNORMAL LOW (ref 39.0–52.0)
HCT: 34.7 % — ABNORMAL LOW (ref 39.0–52.0)
HCT: 33.6 % — ABNORMAL LOW (ref 39.0–52.0)
Hemoglobin: 12 g/dL — ABNORMAL LOW (ref 13.0–17.0)
Hemoglobin: 11.3 g/dL — ABNORMAL LOW (ref 13.0–17.0)
Hemoglobin: 10.8 g/dL — ABNORMAL LOW (ref 13.0–17.0)
MCH: 28.2 pg (ref 26.0–34.0)
MCH: 28 pg (ref 26.0–34.0)
MCH: 28 pg (ref 26.0–34.0)
MCHC: 31.7 g/dL (ref 30.0–36.0)
MCHC: 32.6 g/dL (ref 30.0–36.0)
MCHC: 32.1 g/dL (ref 30.0–36.0)
MCV: 88.7 fL (ref 80.0–100.0)
MCV: 86.1 fL (ref 80.0–100.0)
MCV: 87 fL (ref 80.0–100.0)
Platelets: 279 K/uL (ref 150–400)
Platelets: 253 K/uL (ref 150–400)
Platelets: 255 K/uL (ref 150–400)
RBC: 4.26 MIL/uL (ref 4.22–5.81)
RBC: 4.03 MIL/uL — ABNORMAL LOW (ref 4.22–5.81)
RBC: 3.86 MIL/uL — ABNORMAL LOW (ref 4.22–5.81)
RDW: 13.6 % (ref 11.5–15.5)
RDW: 13.8 % (ref 11.5–15.5)
RDW: 14.1 % (ref 11.5–15.5)
WBC: 8.9 K/uL (ref 4.0–10.5)
WBC: 11.3 K/uL — ABNORMAL HIGH (ref 4.0–10.5)
WBC: 9.1 K/uL (ref 4.0–10.5)
nRBC: 0 % (ref 0.0–0.2)
nRBC: 0 % (ref 0.0–0.2)
nRBC: 0 % (ref 0.0–0.2)

## 2024-07-22 LAB — HEMOGLOBIN A1C
Hgb A1c MFr Bld: 5.5 % (ref 4.8–5.6)
Mean Plasma Glucose: 111.15 mg/dL

## 2024-07-22 LAB — HIV ANTIBODY (ROUTINE TESTING W REFLEX): HIV Screen 4th Generation wRfx: NONREACTIVE

## 2024-07-22 MED ORDER — KETOROLAC TROMETHAMINE 15 MG/ML IJ SOLN
15.0000 mg | Freq: Three times a day (TID) | INTRAMUSCULAR | Status: AC
  Administered 2024-07-22 – 2024-07-23 (×4): 15 mg via INTRAVENOUS
  Filled 2024-07-22 (×4): qty 1

## 2024-07-22 MED ORDER — METHOCARBAMOL 1000 MG/10ML IJ SOLN: 1000.0000 mg | Freq: Four times a day (QID) | INTRAMUSCULAR | Status: DC | PRN

## 2024-07-22 MED ORDER — CEFAZOLIN SODIUM-DEXTROSE 2-4 GM/100ML-% IV SOLN
2.0000 g | Freq: Three times a day (TID) | INTRAVENOUS | Status: AC
  Administered 2024-07-22 (×3): 2 g via INTRAVENOUS
  Filled 2024-07-22 (×3): qty 100

## 2024-07-22 NOTE — TOC CAGE-AID Note (Signed)
 Transition of Care [facility]) - CAGE-AID Screening   Patient Details  Name: Jonathan Livingston MRN: [id number] Date of Birth: 26-Dec-1995  Transition of Care [facility]) CM/SW Contact:    Jalee Saine E Romi Rathel, LCSW Phone Number: 07/22/2024, 9:26 AM   Clinical Narrative: No SA noted.   CAGE-AID Screening:    Have You Ever Felt You Ought to Cut Down on Your Drinking or Drug Use?: No Have People Annoyed You By Critizing Your Drinking Or Drug Use?: No Have You Felt Bad Or Guilty About Your Drinking Or Drug Use?: No Have You Ever Had a Drink or Used Drugs First Thing In The Morning to Steady Your Nerves or to Get Rid of a Hangover?: No CAGE-AID Score: 0  Substance Abuse Education Offered: No
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
## 2024-07-22 NOTE — Progress Notes (Signed)
 Orthopedic Tech Progress Note Patient Details:  Jonathan Livingston 20-Dec-1995 [id number]  Ortho Devices Type of Ortho Device: CAM walker Ortho Device/Splint Location: R FIBULA Ortho Device/Splint Interventions: Ordered, Application   Post Interventions Patient Tolerated: Well Instructions Provided: Care of device  [name] [name] 07/22/2024, 12:27 AM
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
## 2024-07-23 MED ORDER — OXYCODONE HCL 5 MG PO TABS: 5.0000 mg | ORAL_TABLET | ORAL | 0 refills | Status: AC | PRN
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
## 2024-07-23 NOTE — Plan of Care (Signed)
?  Problem: Education: ?Goal: Knowledge of General Education information will improve ?Description: Including pain rating scale, medication(s)/side effects and non-pharmacologic comfort measures ?Outcome: Progressing ?  ?Problem: Health Behavior/Discharge Planning: ?Goal: Ability to manage health-related needs will improve ?Outcome: Progressing ?  ?Problem: Activity: ?Goal: Risk for activity intolerance will decrease ?Outcome: Progressing ?  ?Problem: Nutrition: ?Goal: Adequate nutrition will be maintained ?Outcome: Progressing ?  ?Problem: Elimination: ?Goal: Will not experience complications related to urinary retention ?Outcome: Progressing ?  ?
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
## 2024-07-23 NOTE — Progress Notes (Signed)
 Mobility Specialist Progress Note:   07/23/24 1433  Mobility  Activity Ambulated with assistance (In hallway)  Level of Assistance Contact guard assist, steadying assist  Assistive Device Front wheel walker  Distance Ambulated (ft) 170 ft  RLE Weight Bearing Per Provider Order WBAT  Activity Response Tolerated well  Mobility Referral Yes  Mobility visit 1 Mobility  Mobility Specialist Start Time (ACUTE ONLY) 1408  Mobility Specialist Stop Time (ACUTE ONLY) 1431  Mobility Specialist Time Calculation (min) (ACUTE ONLY) 23 min   Received pt in bed having no complaints and agreeable to mobility. Pt was asymptomatic throughout ambulation and returned to room w/o fault. Left in chair w/ call bell in reach and all needs met.   Lavanda Pollack Mobility Specialist  Please contact via [employer] or  Rehab Office [phone]

## 2024-07-26 ENCOUNTER — Encounter: Payer: Self-pay | Admitting: Family Medicine
